# Patient Record
Sex: Male | Born: 1980 | Race: Black or African American | Hispanic: No | Marital: Married | Smoking: Former smoker
Health system: Southern US, Community
[De-identification: ages and names within clinical notes are randomized; demographics above are authoritative.]

## PROBLEM LIST (undated history)

## (undated) DIAGNOSIS — L0291 Cutaneous abscess, unspecified: Secondary | ICD-10-CM

---

## 2011-06-02 ENCOUNTER — Emergency Department (HOSPITAL_BASED_OUTPATIENT_CLINIC_OR_DEPARTMENT_OTHER)
Admission: EM | Admit: 2011-06-02 | Discharge: 2011-06-02 | Disposition: A | Discharge status: Home / Self Care | Payer: Self-pay | Attending: Emergency Medicine | Admitting: Emergency Medicine

## 2011-06-02 ENCOUNTER — Encounter (HOSPITAL_BASED_OUTPATIENT_CLINIC_OR_DEPARTMENT_OTHER): Payer: Self-pay

## 2011-06-02 ENCOUNTER — Emergency Department (INDEPENDENT_AMBULATORY_CARE_PROVIDER_SITE_OTHER): Payer: Self-pay

## 2011-06-02 DIAGNOSIS — J039 Acute tonsillitis, unspecified: Secondary | ICD-10-CM

## 2011-06-02 DIAGNOSIS — R599 Enlarged lymph nodes, unspecified: Secondary | ICD-10-CM

## 2011-06-02 DIAGNOSIS — J029 Acute pharyngitis, unspecified: Secondary | ICD-10-CM | POA: Insufficient documentation

## 2011-06-02 LAB — BASIC METABOLIC PANEL
BUN: 10 mg/dL (ref 6–23)
CO2: 29 mEq/L (ref 19–32)
Chloride: 100 mEq/L (ref 96–112)
Creatinine, Ser: 1 mg/dL (ref 0.50–1.35)
Glucose, Bld: 87 mg/dL (ref 70–99)
Potassium: 4 mEq/L (ref 3.5–5.1)

## 2011-06-02 LAB — DIFFERENTIAL
Basophils Relative: 0 % (ref 0–1)
Lymphocytes Relative: 26 % (ref 12–46)
Lymphs Abs: 1.6 10*3/uL (ref 0.7–4.0)
Monocytes Absolute: 0.9 10*3/uL (ref 0.1–1.0)
Monocytes Relative: 14 % — ABNORMAL HIGH (ref 3–12)
Neutro Abs: 3.5 10*3/uL (ref 1.7–7.7)
Neutrophils Relative %: 56 % (ref 43–77)

## 2011-06-02 LAB — CBC
HCT: 42.7 % (ref 39.0–52.0)
Hemoglobin: 15.1 g/dL (ref 13.0–17.0)
MCHC: 35.4 g/dL (ref 30.0–36.0)
RBC: 4.88 MIL/uL (ref 4.22–5.81)

## 2011-06-02 MED ORDER — IOHEXOL 350 MG/ML SOLN
100.0000 mL | Freq: Once | INTRAVENOUS | Status: AC | PRN
  Administered 2011-06-02: 100 mL via INTRAVENOUS

## 2011-06-02 MED ORDER — METHYLPREDNISOLONE SODIUM SUCC 125 MG IJ SOLR
125.0000 mg | Freq: Once | INTRAMUSCULAR | Status: AC
  Administered 2011-06-02: 125 mg via INTRAVENOUS
  Filled 2011-06-02: qty 2

## 2011-06-02 MED ORDER — CLINDAMYCIN HCL 150 MG PO CAPS: 300.0000 mg | ORAL_CAPSULE | Freq: Three times a day (TID) | ORAL | Status: AC

## 2011-06-02 MED ORDER — ONDANSETRON HCL 4 MG/2ML IJ SOLN
4.0000 mg | Freq: Once | INTRAMUSCULAR | Status: AC
  Administered 2011-06-02: 4 mg via INTRAVENOUS
  Filled 2011-06-02: qty 2

## 2011-06-02 MED ORDER — HYDROMORPHONE HCL PF 1 MG/ML IJ SOLN
1.0000 mg | Freq: Once | INTRAMUSCULAR | Status: AC
  Administered 2011-06-02: 1 mg via INTRAVENOUS
  Filled 2011-06-02: qty 1

## 2011-06-02 MED ORDER — CLINDAMYCIN PHOSPHATE 600 MG/50ML IV SOLN
600.0000 mg | Freq: Once | INTRAVENOUS | Status: AC
  Administered 2011-06-02: 600 mg via INTRAVENOUS
  Filled 2011-06-02: qty 50

## 2011-06-02 MED ORDER — SODIUM CHLORIDE 0.9 % IV BOLUS (SEPSIS)
1000.0000 mL | Freq: Once | INTRAVENOUS | Status: AC
  Administered 2011-06-02: 1000 mL via INTRAVENOUS

## 2011-06-02 MED ORDER — PREDNISONE 10 MG PO TABS: 20.0000 mg | ORAL_TABLET | Freq: Every day | ORAL | Status: DC

## 2011-06-02 MED ORDER — OXYCODONE-ACETAMINOPHEN 5-325 MG PO TABS: 1.0000 | ORAL_TABLET | ORAL | Status: AC | PRN

## 2011-06-02 NOTE — ED Provider Notes (Signed)
History     CSN: [id number]  Arrival date & time 06/02/11  1114   First MD Initiated Contact with Patient 06/02/11 1149      Chief Complaint  Patient presents with  . Sore Throat    (Consider location/radiation/quality/duration/timing/severity/associated sxs/prior treatment) HPI  History reviewed. No pertinent past medical history.  History reviewed. No pertinent past surgical history.  History reviewed. No pertinent family history.  History  Substance Use Topics  . Smoking status: Current Everyday Smoker    Types: Cigars  . Smokeless tobacco: Never Used  . Alcohol Use: Yes     occasional use      Review of Systems  Allergies  Review of patient's allergies indicates no known allergies.  Home Medications   Current Outpatient Rx  Name Route Sig Dispense Refill  . HYDROCODONE-ACETAMINOPHEN 5-325 MG PO TABS Oral Take 1 tablet by mouth once.    Marland Kitchen PHENOL 1.4 % MT LIQD Mouth/Throat Use as directed 2 sprays in the mouth or throat as needed.      BP 126/78  Pulse 63  Temp(Src) 99.1 F (37.3 C) (Oral)  Resp 18  Ht 5\' 11"  (1.803 m)  Wt 155 lb (70.308 kg)  BMI 21.62 kg/m2  SpO2 99%  Physical Exam  ED Course  Procedures (including critical care time)  Labs Reviewed  DIFFERENTIAL - Abnormal; Notable for the following:    Monocytes Relative 14 (*)    All other components within normal limits  CBC  BASIC METABOLIC PANEL  RAPID STREP SCREEN   Ct Soft Tissue Neck W Contrast  06/02/2011  *RADIOLOGY REPORT*  Clinical Data: Sore throat.  The  CT NECK WITH CONTRAST  Technique:  Multidetector CT imaging of the neck was performed with intravenous contrast.  Contrast: OMNIPAQUE IOHEXOL 350 MG/ML SOLN  Comparison: None.  Findings: There is inflammation of the left and right tonsillar pillars with edema which distorts but does not obstruct the airway. There is low attenuation material associated with the right tonsil which could represent an early abscess but no  organized fluid collections present at this point.  Calcification within the left tonsil is likely postinflammatory. Pontine tonsils are thickened. There is enlarged level II lymph node on the right measuring 12 mm (image 50).  Smaller level II lymph nodes on the left.  No evidence of vascular complication.  The epiglottis and glottis and subglottic tissues are normal.  Limited view of the lung apices are normal.  No aggressive osseous lesions.  The retropharyngeal space is normal.  IMPRESSION:  1. Tonsillitis with significant edema of the tonsils greater on the right.  Low attenuation material associated with the right tonsil could represent early abscess formation.  No drainable collection present at the current time.  2. Airway is distorted but not obstructed by the edematous tissue.  3. .  Cervical adenopathy consistent with tonsillitis.  Original Report Authenticated By: Genevive Bi, M.D.     No diagnosis found.    MDM  The patient's care discussed with Dr. Jearld Fenton.  Patient instructed to take antibiotics and to return if he is worse at any time. He is also given a referral number to Dr. Jearld Fenton office and can call there tomorrow if he is not improving. Otherwise, he is to call Dr. Chipper Herb office on Monday morning for recheck on Monday.        Hilario Quarry, MD 06/02/11 1435

## 2011-06-02 NOTE — ED Notes (Signed)
Mother and wife at bedside rubbing pt stating, "its going to be ok." Warm blankets provided.

## 2011-06-02 NOTE — Discharge Instructions (Signed)
Pharyngitis, Viral and Bacterial Pharyngitis is soreness (inflammation) or infection of the pharynx. It is also called a sore throat. CAUSES  Most sore throats are caused by viruses and are part of a cold. However, some sore throats are caused by strep and other bacteria. Sore throats can also be caused by post nasal drip from draining sinuses, allergies and sometimes from sleeping with an open mouth. Infectious sore throats can be spread from person to person by coughing, sneezing and sharing cups or eating utensils. TREATMENT  Sore throats that are viral usually last 3-4 days. Viral illness will get better without medications (antibiotics). Strep throat and other bacterial infections will usually begin to get better about 24-48 hours after you begin to take antibiotics. HOME CARE INSTRUCTIONS   If the caregiver feels there is a bacterial infection or if there is a positive strep test, they will prescribe an antibiotic. The full course of antibiotics must be taken. If the full course of antibiotic is not taken, you or your child may become ill again. If you or your child has strep throat and do not finish all of the medication, serious heart or kidney diseases may develop.   Drink enough water and fluids to keep your urine clear or pale yellow.   Only take over-the-counter or prescription medicines for pain, discomfort or fever as directed by your caregiver.   Get lots of rest.   Gargle with salt water ( tsp. of salt in a glass of water) as often as every 1-2 hours as you need for comfort.   Hard candies may soothe the throat if individual is not at risk for choking. Throat sprays or lozenges may also be used.  SEEK MEDICAL CARE IF:   Large, tender lumps in the neck develop.   A rash develops.   Green, yellow-brown or bloody sputum is coughed up.   Your baby is older than 3 months with a rectal temperature of 100.5 F (38.1 C) or higher for more than 1 day.  SEEK IMMEDIATE MEDICAL CARE  IF:   A stiff neck develops.   You or your child are drooling or unable to swallow liquids.   You or your child are vomiting, unable to keep medications or liquids down.   You or your child has severe pain, unrelieved with recommended medications.   You or your child are having difficulty breathing (not due to stuffy nose).   You or your child are unable to fully open your mouth.   You or your child develop redness, swelling, or severe pain anywhere on the neck.   You have a fever.   Your baby is older than 3 months with a rectal temperature of 102 F (38.9 C) or higher.   Your baby is 41 months old or younger with a rectal temperature of 100.4 F (38 C) or higher.  MAKE SURE YOU:   Understand these instructions.   Will watch your condition.   Will get help right away if you are not doing well or get worse.  Document Released: 02/05/2005 Document Revised: 01/25/2011 Document Reviewed: 05/05/2007 [facility] Patient Information 2012 [place], [place].  Return here if you are worse at any time. Call Dr. Jearld Fenton tomorrow if you are not improving. Call Dr. Jearld Fenton office Monday morning for recheck on Monday regardless.

## 2011-06-02 NOTE — ED Notes (Signed)
Pt was able to speak and tell me that he took hydrocodone last night for his sore throat.

## 2011-06-02 NOTE — ED Notes (Signed)
Pt presents today with sore throat, pt refuses to talk, pt's wife is at bedside doing all the talking.  She states that pt has not been seen by pcp, taking lozenges and chloraseptic spray with no relief.

## 2013-01-09 ENCOUNTER — Emergency Department (HOSPITAL_BASED_OUTPATIENT_CLINIC_OR_DEPARTMENT_OTHER)
Admission: EM | Admit: 2013-01-09 | Discharge: 2013-01-09 | Disposition: A | Discharge status: Home / Self Care | Payer: Commercial Managed Care - PPO | Attending: Emergency Medicine | Admitting: Emergency Medicine

## 2013-01-09 ENCOUNTER — Encounter (HOSPITAL_BASED_OUTPATIENT_CLINIC_OR_DEPARTMENT_OTHER): Payer: Self-pay | Admitting: Emergency Medicine

## 2013-01-09 DIAGNOSIS — W268XXA Contact with other sharp object(s), not elsewhere classified, initial encounter: Secondary | ICD-10-CM | POA: Insufficient documentation

## 2013-01-09 DIAGNOSIS — L089 Local infection of the skin and subcutaneous tissue, unspecified: Secondary | ICD-10-CM | POA: Insufficient documentation

## 2013-01-09 DIAGNOSIS — Y929 Unspecified place or not applicable: Secondary | ICD-10-CM | POA: Insufficient documentation

## 2013-01-09 DIAGNOSIS — F17200 Nicotine dependence, unspecified, uncomplicated: Secondary | ICD-10-CM | POA: Insufficient documentation

## 2013-01-09 DIAGNOSIS — Z23 Encounter for immunization: Secondary | ICD-10-CM | POA: Insufficient documentation

## 2013-01-09 DIAGNOSIS — Y939 Activity, unspecified: Secondary | ICD-10-CM | POA: Insufficient documentation

## 2013-01-09 MED ORDER — CEPHALEXIN 500 MG PO CAPS: 500.0000 mg | ORAL_CAPSULE | Freq: Three times a day (TID) | ORAL | Status: DC

## 2013-01-09 MED ORDER — TETANUS-DIPHTH-ACELL PERTUSSIS 5-2.5-18.5 LF-MCG/0.5 IM SUSP
0.5000 mL | Freq: Once | INTRAMUSCULAR | Status: AC
  Administered 2013-01-09: 0.5 mL via INTRAMUSCULAR
  Filled 2013-01-09: qty 0.5

## 2013-01-09 NOTE — ED Notes (Signed)
States opened what he thought was a splinter tip of rt thumb   Now painful and states has been draining pus

## 2013-01-09 NOTE — ED Provider Notes (Signed)
CSN: [id number]     Arrival date & time 01/09/13  2056 History   First MD Initiated Contact with Patient 01/09/13 2110     Chief Complaint  Patient presents with  . Insect Bite   (Consider location/radiation/quality/duration/timing/severity/associated sxs/prior Treatment) HPI  This is a 32 year old male who presents the emergency department chief complaint of right thumb pain.  The patient states that this morning he awoke with throbbing pain in his thumb.  Thought that he saw a splinter.  Patient proceeded to try to remove the splinter with a safety pin.  Patient states he was unable to retrieve any foreign body.  Throughout the day he had increasingly worsening pain, throbbing, nonradiating.  He does have swelling in the tip of his thumb.  Feeding the patient tried to re\re open the area with the pain and had purulent and red bloody discharge from the area.  He still did not see a splinter.  Patient's pain is decreased after opening the area.  He denies any numbness, tingling, red streaking, fevers, chills, myalgias or malaise.  He is not diabetic.   History reviewed. No pertinent past medical history. History reviewed. No pertinent past surgical history. History reviewed. No pertinent family history. History  Substance Use Topics  . Smoking status: Current Every Day Smoker    Types: Cigars  . Smokeless tobacco: Never Used  . Alcohol Use: Yes     Comment: occasional use    Review of Systems Ten systems reviewed and are negative for acute change, except as noted in the HPI.    Allergies  Review of patient's allergies indicates no known allergies.  Home Medications  No current outpatient prescriptions on file. BP 121/77  Pulse 61  Temp(Src) 98.7 F (37.1 C) (Oral)  Resp 16  Ht 5\' 11"  (1.803 m)  Wt 155 lb (70.308 kg)  BMI 21.63 kg/m2  SpO2 99% Physical Exam Physical Exam  Nursing note and vitals reviewed. Constitutional: He appears well-developed and well-nourished. No  distress.  HENT:  Head: Normocephalic and atraumatic.  Eyes: Conjunctivae normal are normal. No scleral icterus.  Neck: Normal range of motion. Neck supple.  Cardiovascular: Normal rate, regular rhythm and normal heart sounds.   Pulmonary/Chest: Effort normal and breath sounds normal. No respiratory distress.  Abdominal: Soft. There is no tenderness.  Musculoskeletal: He exhibits no edema.  Neurological: He is alert.  Skin: Right, with punctate area that is draining pus.  No foreign body seen or palpated at this time.  Is that of herpetic with low or pulp infection indicating felon.  The streaking.   Psychiatric: His behavior is normal.    ED Course  Procedures (including critical care time) Labs Review Labs Reviewed - No data to display Imaging Review No results found.  EKG Interpretation   None       MDM   1. Superficial foreign body (splinter) of fingers, without major open wound, infected, initial encounter     patient with likely retained foreign body and infection.  Patient will be given Keflex and discharged.  He is to return in 48 hours or sooner if his symptoms are unresolved or worsening.  Patient expresses understanding of plan and agrees.  Tetanus updated today.  Return precautions discussed.  The patient appears reasonably screened and/or stabilized for discharge and I doubt any other medical condition or other [facility] requiring further screening, evaluation, or treatment in the ED at this time prior to discharge.     Arthor Captain, PA-C 01/09/13 2204

## 2013-01-14 NOTE — ED Provider Notes (Signed)
Medical screening examination/treatment/procedure(s) were performed by non-physician practitioner and as supervising physician I was immediately available for consultation/collaboration.  EKG Interpretation   None         Curlee Bogan J Galilea Quito, MD 01/14/13 0722 

## 2014-06-14 ENCOUNTER — Encounter (HOSPITAL_BASED_OUTPATIENT_CLINIC_OR_DEPARTMENT_OTHER): Payer: Self-pay | Admitting: Emergency Medicine

## 2014-06-14 ENCOUNTER — Emergency Department (HOSPITAL_BASED_OUTPATIENT_CLINIC_OR_DEPARTMENT_OTHER)
Admission: EM | Admit: 2014-06-14 | Discharge: 2014-06-14 | Disposition: A | Discharge status: Home / Self Care | Payer: BLUE CROSS/BLUE SHIELD | Attending: Emergency Medicine | Admitting: Emergency Medicine

## 2014-06-14 DIAGNOSIS — J029 Acute pharyngitis, unspecified: Secondary | ICD-10-CM | POA: Diagnosis not present

## 2014-06-14 DIAGNOSIS — R509 Fever, unspecified: Secondary | ICD-10-CM | POA: Diagnosis not present

## 2014-06-14 DIAGNOSIS — Z720 Tobacco use: Secondary | ICD-10-CM | POA: Diagnosis not present

## 2014-06-14 DIAGNOSIS — Z872 Personal history of diseases of the skin and subcutaneous tissue: Secondary | ICD-10-CM | POA: Insufficient documentation

## 2014-06-14 HISTORY — DX: Cutaneous abscess, unspecified: L02.91

## 2014-06-14 LAB — RAPID STREP SCREEN (MED CTR MEBANE ONLY): STREPTOCOCCUS, GROUP A SCREEN (DIRECT): NEGATIVE

## 2014-06-14 MED ORDER — MAGIC MOUTHWASH W/LIDOCAINE: 10.0000 mL | Freq: Four times a day (QID) | ORAL | Status: AC | PRN

## 2014-06-14 MED ORDER — HYDROCODONE-ACETAMINOPHEN 5-325 MG PO TABS: 1.0000 | ORAL_TABLET | ORAL | Status: DC | PRN

## 2014-06-14 MED ORDER — LIDOCAINE VISCOUS 2 % MT SOLN
15.0000 mL | Freq: Once | OROMUCOSAL | Status: AC
  Administered 2014-06-14: 15 mL via OROMUCOSAL
  Filled 2014-06-14: qty 15

## 2014-06-14 NOTE — ED Provider Notes (Signed)
Medical screening examination/treatment/procedure(s) were conducted as a shared visit with non-physician practitioner(s) and myself.  I personally evaluated the patient during the encounter.   EKG Interpretation None      Patient seen by me. Patient with a sore throat with acute onset just this morning. No fever some chills no nausea vomiting or diarrhea no upper respiratory symptoms yet. The pharynx has some mild redness no uvula shift no evidence of peritonsillar abscess. Has a little bit of adenopathy more pronounced on the left submandibular area.  Patient was concerned about her recurrent peritonsillar abscess from 2013 but sounds like maybe it wasn't a peritonsillar abscess it may have been just pharyngitis. Patient was never referred to ear nose and throat never had anything opened and drained. Patient was just treated with antibiotics.   Patient currently nontoxic no acute distress. Rapid strep was negative. Be followed up by throat culture. Patient will be treated for pain. Will be contacted if the strep culture is positive.  Results for orders placed or performed during the hospital encounter of 06/14/14  Rapid strep screen  Result Value Ref Range   Streptococcus, Group A Screen (Direct) NEGATIVE NEGATIVE     Vanetta MuldersScott Bryceson Grape, MD 06/14/14 1113

## 2014-06-14 NOTE — Discharge Instructions (Signed)

## 2014-06-14 NOTE — ED Provider Notes (Signed)
CSN: [id number]     Arrival date & time 06/14/14  0920 History   First MD Initiated Contact with Patient 06/14/14 [phone]     Chief Complaint  Patient presents with  . Sore Throat     (Consider location/radiation/quality/duration/timing/severity/associated sxs/prior Treatment) The history is provided by the patient and the spouse.   Victor Donovan is a 34 y.o. male presenting with sore throat and fever which he woke with this am.  He denies sx going to bed last night.  Fever has been subjective, but is 99.7 when measured here.  Pain is worsened with swallowing.  He feels a swelling sensation in his throat but denies difficulty breathing.  He was able to eat breakfast prior to arrival, but since arriving here has been spitting rather than swallowing his saliva.  He has taken no medicines prior to arrival.  He reports prior peritonsillar abscess and states his current symptoms remind him of this prior infection.     Past Medical History  Diagnosis Date  . Abscess    No past surgical history on file. No family history on file. History  Substance Use Topics  . Smoking status: Current Every Day Smoker    Types: Cigars  . Smokeless tobacco: Never Used  . Alcohol Use: Yes     Comment: occasional use    Review of Systems  Constitutional: Positive for fever and chills.  HENT: Positive for sore throat. Negative for congestion, ear pain, facial swelling, rhinorrhea, sinus pressure, trouble swallowing and voice change.   Eyes: Negative for discharge.  Respiratory: Negative for cough, shortness of breath, wheezing and stridor.   Cardiovascular: Negative for chest pain.  Gastrointestinal: Negative for abdominal pain.  Genitourinary: Negative.       Allergies  Review of patient's allergies indicates no known allergies.  Home Medications   Prior to Admission medications   Medication Sig Start Date End Date Taking? Authorizing Provider  Alum & Mag Hydroxide-Simeth (MAGIC MOUTHWASH  W/LIDOCAINE) SOLN Take 10 mLs by mouth 4 (four) times daily as needed (throat pain). 06/14/14   Burgess Amor, PA-C  HYDROcodone-acetaminophen (NORCO/VICODIN) 5-325 MG per tablet Take 1 tablet by mouth every 4 (four) hours as needed for moderate pain. 06/14/14   Burgess Amor, PA-C   BP 131/95 mmHg  Pulse 74  Temp(Src) 99.7 F (37.6 C) (Oral)  Resp 16  Ht 6' (1.829 m)  Wt 167 lb (75.751 kg)  BMI 22.64 kg/m2  SpO2 100% Physical Exam  Constitutional: He is oriented to person, place, and time. He appears well-developed and well-nourished.  HENT:  Head: Normocephalic and atraumatic.  Right Ear: Tympanic membrane and ear canal normal.  Left Ear: Tympanic membrane and ear canal normal.  Nose: No mucosal edema or rhinorrhea.  Mouth/Throat: Uvula is midline and mucous membranes are normal. No trismus in the jaw. Posterior oropharyngeal erythema present. No oropharyngeal exudate, posterior oropharyngeal edema or tonsillar abscesses.  Eyes: Conjunctivae are normal.  Cardiovascular: Normal rate and normal heart sounds.   Pulmonary/Chest: Effort normal. No stridor. No respiratory distress. He has no wheezes. He has no rales.  Abdominal: Soft. There is no tenderness.  Musculoskeletal: Normal range of motion.  Lymphadenopathy:       Head (left side): Submandibular adenopathy present.  Neurological: He is alert and oriented to person, place, and time.  Skin: Skin is warm and dry. No rash noted.  Psychiatric: He has a normal mood and affect.    ED Course  Procedures (including critical care time)  Labs Review Labs Reviewed  RAPID STREP SCREEN  CULTURE, GROUP A STREP    Imaging Review No results found.   EKG Interpretation None      MDM   Final diagnoses:  Viral pharyngitis    Patients labs and/or radiological studies were reviewed and considered during the medical decision making and disposition process.  Results were also discussed with patient. Pt advised that strep test is followed  up by strep culture.  There is no sign of peritonsillar abscess on exam today. Pt was also seen by Dr Deretha EmoryZackowski during this visit.  Hydrocodone #10, magic mouthwash, rest, increased fluid intake, motrin.  Recheck for any worsened sx.    Burgess AmorJulie Channie Bostick, PA-C 06/14/14 1115

## 2014-06-14 NOTE — ED Notes (Signed)
MD at bedside. 

## 2014-06-14 NOTE — ED Notes (Signed)
Sore throat with swelling this am.  No known fever, some chills.  No N/V/D.

## 2014-06-16 LAB — CULTURE, GROUP A STREP: Strep A Culture: NEGATIVE

## 2017-10-09 ENCOUNTER — Emergency Department (HOSPITAL_BASED_OUTPATIENT_CLINIC_OR_DEPARTMENT_OTHER): Payer: BLUE CROSS/BLUE SHIELD

## 2017-10-09 ENCOUNTER — Encounter (HOSPITAL_BASED_OUTPATIENT_CLINIC_OR_DEPARTMENT_OTHER): Payer: Self-pay

## 2017-10-09 ENCOUNTER — Emergency Department (HOSPITAL_BASED_OUTPATIENT_CLINIC_OR_DEPARTMENT_OTHER)
Admission: EM | Admit: 2017-10-09 | Discharge: 2017-10-09 | Disposition: A | Discharge status: Home / Self Care | Payer: BLUE CROSS/BLUE SHIELD | Attending: Emergency Medicine | Admitting: Emergency Medicine

## 2017-10-09 ENCOUNTER — Other Ambulatory Visit: Payer: Self-pay

## 2017-10-09 DIAGNOSIS — Y939 Activity, unspecified: Secondary | ICD-10-CM | POA: Insufficient documentation

## 2017-10-09 DIAGNOSIS — M545 Low back pain, unspecified: Secondary | ICD-10-CM

## 2017-10-09 DIAGNOSIS — Z87891 Personal history of nicotine dependence: Secondary | ICD-10-CM | POA: Insufficient documentation

## 2017-10-09 DIAGNOSIS — Y999 Unspecified external cause status: Secondary | ICD-10-CM | POA: Diagnosis not present

## 2017-10-09 DIAGNOSIS — Y92410 Unspecified street and highway as the place of occurrence of the external cause: Secondary | ICD-10-CM | POA: Insufficient documentation

## 2017-10-09 MED ORDER — IBUPROFEN 600 MG PO TABS: 600.0000 mg | ORAL_TABLET | Freq: Four times a day (QID) | ORAL | 0 refills | Status: DC | PRN

## 2017-10-09 MED ORDER — ACETAMINOPHEN 500 MG PO TABS: 500.0000 mg | ORAL_TABLET | Freq: Four times a day (QID) | ORAL | 0 refills | Status: DC | PRN
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
## 2017-10-09 NOTE — Discharge Instructions (Signed)
Alternate 600 mg of ibuprofen and 500-1000 mg of Tylenol every 3 hours as needed for pain. Do not exceed 4000 mg of Tylenol daily. Ice to areas of soreness for the next few days and then may move to heat. Do some gentle stretching throughout the day, especially during hot showers or baths. Take short frequent walks and avoid prolonged periods of sitting or laying. Expect to be sore for the next few day and follow up with primary care physician for recheck of ongoing symptoms but return to ER for emergent changing or worsening of symptoms such as severe headache that gets worse, altered mental status/behaving unusually, persistent vomiting, excessive drowsiness, numbness to the arms or legs, unsteady gait, or slurred speech. ° °
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
## 2021-08-15 ENCOUNTER — Encounter (HOSPITAL_BASED_OUTPATIENT_CLINIC_OR_DEPARTMENT_OTHER): Payer: Self-pay | Admitting: Emergency Medicine

## 2021-08-15 ENCOUNTER — Emergency Department (HOSPITAL_BASED_OUTPATIENT_CLINIC_OR_DEPARTMENT_OTHER)
Admission: EM | Admit: 2021-08-15 | Discharge: 2021-08-15 | Disposition: A | Discharge status: Home / Self Care | Payer: BLUE CROSS/BLUE SHIELD | Attending: Emergency Medicine | Admitting: Emergency Medicine

## 2021-08-15 ENCOUNTER — Other Ambulatory Visit: Payer: Self-pay

## 2021-08-15 ENCOUNTER — Emergency Department (HOSPITAL_BASED_OUTPATIENT_CLINIC_OR_DEPARTMENT_OTHER): Payer: BLUE CROSS/BLUE SHIELD

## 2021-08-15 DIAGNOSIS — S2232XA Fracture of one rib, left side, initial encounter for closed fracture: Secondary | ICD-10-CM | POA: Diagnosis not present

## 2021-08-15 DIAGNOSIS — W19XXXA Unspecified fall, initial encounter: Secondary | ICD-10-CM | POA: Insufficient documentation

## 2021-08-15 DIAGNOSIS — S299XXA Unspecified injury of thorax, initial encounter: Secondary | ICD-10-CM | POA: Diagnosis present

## 2021-08-15 MED ORDER — OXYCODONE-ACETAMINOPHEN 5-325 MG PO TABS
2.0000 | ORAL_TABLET | Freq: Once | ORAL | Status: AC
  Administered 2021-08-15: 2 via ORAL
  Filled 2021-08-15: qty 2

## 2021-08-15 MED ORDER — IBUPROFEN 800 MG PO TABS: 800.0000 mg | ORAL_TABLET | Freq: Three times a day (TID) | ORAL | 0 refills | Status: AC

## 2021-08-15 MED ORDER — OXYCODONE-ACETAMINOPHEN 5-325 MG PO TABS: 1.0000 | ORAL_TABLET | Freq: Three times a day (TID) | ORAL | 0 refills | Status: AC | PRN

## 2021-08-15 MED ORDER — KETOROLAC TROMETHAMINE 60 MG/2ML IM SOLN
15.0000 mg | Freq: Once | INTRAMUSCULAR | Status: AC
  Administered 2021-08-15: 15 mg via INTRAMUSCULAR
  Filled 2021-08-15: qty 2
